# Patient Record
Sex: Male | Born: 1970 | Race: White | Hispanic: No | Marital: Married | Smoking: Never smoker
Health system: Southern US, Community
[De-identification: ages and names within clinical notes are randomized; demographics above are authoritative.]

---

## 2012-08-08 ENCOUNTER — Emergency Department: Payer: Self-pay | Admitting: Emergency Medicine

## 2012-08-10 ENCOUNTER — Emergency Department: Payer: Self-pay | Admitting: Unknown Physician Specialty

## 2018-07-19 ENCOUNTER — Encounter: Payer: Self-pay | Admitting: Emergency Medicine

## 2018-07-19 ENCOUNTER — Other Ambulatory Visit: Payer: Self-pay

## 2018-07-19 ENCOUNTER — Emergency Department
Admission: EM | Admit: 2018-07-19 | Discharge: 2018-07-19 | Disposition: A | Discharge status: Home / Self Care | Payer: BC Managed Care – PPO | Attending: Emergency Medicine | Admitting: Emergency Medicine

## 2018-07-19 ENCOUNTER — Emergency Department: Payer: BC Managed Care – PPO

## 2018-07-19 DIAGNOSIS — R202 Paresthesia of skin: Secondary | ICD-10-CM | POA: Diagnosis present

## 2018-07-19 DIAGNOSIS — Z79899 Other long term (current) drug therapy: Secondary | ICD-10-CM | POA: Insufficient documentation

## 2018-07-19 DIAGNOSIS — R002 Palpitations: Secondary | ICD-10-CM | POA: Diagnosis not present

## 2018-07-19 DIAGNOSIS — R200 Anesthesia of skin: Secondary | ICD-10-CM | POA: Insufficient documentation

## 2018-07-19 DIAGNOSIS — R61 Generalized hyperhidrosis: Secondary | ICD-10-CM | POA: Diagnosis not present

## 2018-07-19 LAB — DIFFERENTIAL
Abs Immature Granulocytes: 0.02 10*3/uL (ref 0.00–0.07)
Basophils Absolute: 0 10*3/uL (ref 0.0–0.1)
Basophils Relative: 0 %
Eosinophils Absolute: 0.1 10*3/uL (ref 0.0–0.5)
Eosinophils Relative: 2 %
Immature Granulocytes: 0 %
Lymphocytes Relative: 15 %
Lymphs Abs: 1 10*3/uL (ref 0.7–4.0)
Monocytes Absolute: 0.9 10*3/uL (ref 0.1–1.0)
Monocytes Relative: 13 %
Neutro Abs: 4.9 10*3/uL (ref 1.7–7.7)
Neutrophils Relative %: 70 %

## 2018-07-19 LAB — COMPREHENSIVE METABOLIC PANEL
ALT: 38 U/L (ref 0–44)
AST: 24 U/L (ref 15–41)
Albumin: 4.4 g/dL (ref 3.5–5.0)
Alkaline Phosphatase: 61 U/L (ref 38–126)
Anion gap: 10 (ref 5–15)
BUN: 9 mg/dL (ref 6–20)
CO2: 27 mmol/L (ref 22–32)
Calcium: 9.2 mg/dL (ref 8.9–10.3)
Chloride: 101 mmol/L (ref 98–111)
Creatinine, Ser: 0.86 mg/dL (ref 0.61–1.24)
GFR calc Af Amer: >60 mL/min (ref >60)
GFR calc non Af Amer: >60 mL/min (ref >60)
Glucose, Bld: 116 mg/dL — ABNORMAL HIGH (ref 70–99)
Potassium: 3.8 mmol/L (ref 3.5–5.1)
Sodium: 138 mmol/L (ref 135–145)
Total Bilirubin: 0.6 mg/dL (ref 0.3–1.2)
Total Protein: 7.5 g/dL (ref 6.5–8.1)

## 2018-07-19 LAB — CBC
HCT: 44.6 % (ref 39.0–52.0)
Hemoglobin: 15.3 g/dL (ref 13.0–17.0)
MCH: 32.5 pg (ref 26.0–34.0)
MCHC: 34.3 g/dL (ref 30.0–36.0)
MCV: 94.7 fL (ref 80.0–100.0)
Platelets: 211 10*3/uL (ref 150–400)
RBC: 4.71 MIL/uL (ref 4.22–5.81)
RDW: 12.2 % (ref 11.5–15.5)
WBC: 7 10*3/uL (ref 4.0–10.5)
nRBC: 0 % (ref 0.0–0.2)

## 2018-07-19 MED ORDER — ASPIRIN EC 81 MG PO TBEC
81.0000 mg | DELAYED_RELEASE_TABLET | Freq: Once | ORAL | Status: AC
  Administered 2018-07-19: 81 mg via ORAL

## 2018-07-19 MED ORDER — SODIUM CHLORIDE 0.9% FLUSH: 3.0000 mL | Freq: Once | INTRAVENOUS | Status: DC

## 2018-07-19 NOTE — ED Notes (Signed)
Reviewed discharge instructions, follow-up care, and medications with patient. Patient verbalized understanding of all information reviewed. Patient stable, with no distress noted at this time.    

## 2018-07-19 NOTE — ED Provider Notes (Signed)
[facility] Emergency Department Provider Note  ____________________________________________   First MD Initiated Contact with Patient 07/19/18 1902     (approximate)  I have reviewed the triage vital signs and the nursing notes.   HISTORY  Chief Complaint Numbness    HPI Serafina MitchellJason Carino is a 48 y.o. male reports no major past medical history  At about 5 PM, patient was at his home he started to feel a tingly feeling while resting in his left leg. Reports he thinks he might of "panic" a little bit surface of the tingling almost in his arm and also in his leg.  He never lost sensation but felt tingly.  Reports by the time he got to the waiting room at the ER his symptoms had got better and they have all gone away.  He did get tested for coronavirus about 3 days ago but the result is not returned.  He had some loose stools nausea and diarrhea which is resolved now.  Reports he is feeling quite a lot better.  He is eating and drinking well.  He was little dehydrated earlier, but that is better and he is staying hydrated now  No history of stroke.  Does not smoke.  Denies history of high blood pressure.  He did not have any trouble speaking.  There is no weakness in his arms or legs.  He is able to walk.  Did not have any facial droop.  All symptoms are resolved now.     History reviewed. No pertinent past medical history.  There are no active problems to display for this patient.   History reviewed. No pertinent surgical history.  Prior to Admission medications   Medication Sig Start Date End Date Taking? Authorizing Provider  omeprazole (PRILOSEC) 40 MG capsule Take 40 mg by mouth daily.   Yes [provider]    Allergies Patient has no known allergies.  History reviewed. No pertinent family history.  Social History Social History   Tobacco Use  . Smoking status: Never Smoker  . Smokeless tobacco: Never Used  Substance Use Topics  .  Alcohol use: Yes    Frequency: Never  . Drug use: Never    Review of Systems Constitutional: No fever/chills except as noted in HPI Eyes: No visual changes. ENT: No sore throat. Cardiovascular: Denies chest pain. Respiratory: Denies shortness of breath. Gastrointestinal: No abdominal pain.  See HPI, just getting over a stomach bug which is improved Genitourinary: Negative for dysuria. Musculoskeletal: Negative for back pain. Skin: Negative for rash. Neurological: Negative for headaches, areas of focal weakness or numbness except as noted.  All resolved now.    ____________________________________________   PHYSICAL EXAM:  VITAL SIGNS: ED Triage Vitals  Enc Vitals Group     BP 07/19/18 1806 108/80     Pulse Rate 07/19/18 1806 (!) 105     Resp 07/19/18 1824 14     Temp 07/19/18 1806 99.1 F (37.3 C)     Temp Source 07/19/18 1806 Oral     SpO2 07/19/18 1806 99 %     Weight 07/19/18 1807 172 lb (78 kg)     Height 07/19/18 1807 5\' 10"  (1.778 m)     Head Circumference --      Peak Flow --      Pain Score 07/19/18 1824 0     Pain Loc --      Pain Edu? --      Excl. in GC? --  Constitutional: Alert and oriented. Well appearing and in no acute distress. Eyes: Conjunctivae are normal. Head: Atraumatic. Nose: No congestion/rhinnorhea. Mouth/Throat: Mucous membranes are moist. Neck: No stridor.  Cardiovascular: Normal rate, regular rhythm. Grossly normal heart sounds.  Good peripheral circulation. Respiratory: Normal respiratory effort.  No retractions. Lungs CTAB. Gastrointestinal: Soft and nontender. No distention. Musculoskeletal: No lower extremity tenderness nor edema. Neurologic:  Normal speech and language. No gross focal neurologic deficits are appreciated.  His NIH score equals 0.  I performed neurologic examination in the presence of tele-stroke doctor as well, and we both agree his NIH is 0 Skin:  Skin is warm, dry and intact. No rash noted. Psychiatric: Mood  and affect are normal. Speech and behavior are normal.  ____________________________________________   LABS (all labs ordered are listed, but only abnormal results are displayed)  Labs Reviewed  COMPREHENSIVE METABOLIC PANEL - Abnormal; Notable for the following components:      Result Value   Glucose, Bld 116 (*)    All other components within normal limits  CBC  DIFFERENTIAL  PROTIME-INR  APTT  CBG MONITORING, ED   ____________________________________________  EKG  Reviewed and entered by me/interpreted at 1810 Heart rate 80 QRS 110 QTc 420 Normal sinus rhythm, no evidence of ischemia.  No A. fib ____________________________________________  RADIOLOGY  Normal CT of the head discussed with radiologist ____________________________________________   PROCEDURES  Procedure(s) performed: None  Procedures  Critical Care performed: No  ____________________________________________   INITIAL IMPRESSION / ASSESSMENT AND PLAN / ED COURSE  Pertinent labs & imaging results that were available during my care of the patient were reviewed by me and considered in my medical decision making (see chart for details).   Brief paresthesia involving left leg, perhaps some in the left arm as well.  Resolved prior to arrival.  Not associated with any gross neurologic deficits in his age NIH score is 0.  Clinical Course as of Jul 19 2026  Fri Jul 19, 2018  7858 Made aware of patient by triage nurse.  Working to see and evaluate him shortly, he did report his symptoms were gone.  Have to evaluate another patient prior to being able to see this gentleman, but I do know evaluation is primarily   [MQ]    Clinical Course User Index [MQ] Delman Kitten, MD   He had a brief gastrointestinal-like illness which she reports is resolved.  He is well-appearing and in no acute distress.  He has no risk factor for stroke.  Both tele-neurologist and I formulated a plan with the patient present and  with his input, and he is comfortable with the plan to follow-up with outpatient neurologist and start baby aspirin daily.  Neurology also recommends that he set up follow-up with cardiology for possible heart monitor to rule out A. fib, and the patient is in agreement with this.  Raedyn Wenke was evaluated in Emergency Department on 07/19/2018 for the symptoms described in the history of present illness. He was evaluated in the context of the global COVID-19 pandemic, which necessitated consideration that the patient might be at risk for infection with the SARS-CoV-2 virus that causes COVID-19. Institutional protocols and algorithms that pertain to the evaluation of patients at risk for COVID-19 are in a state of rapid change based on information released by regulatory bodies including the CDC and federal and state organizations. These policies and algorithms were followed during the patient's care in the ED.  He does not express active COVID symptoms.  He does however have a pending COVID test that should result in the next day.  Return precautions and treatment recommendations and follow-up discussed with the patient who is agreeable with the plan.   ____________________________________________   FINAL CLINICAL IMPRESSION(S) / ED DIAGNOSES  Final diagnoses:  Paresthesia        Note:  This document was prepared using Dragon voice recognition software and may include unintentional dictation errors       Sharyn CreamerQuale, Mark, MD 07/19/18 2034

## 2018-07-19 NOTE — Consult Note (Signed)
[facility] Tele[facility]urology Consult Services   Date of Service:   07/19/2018 19:11:44  Impression:     .  Transient Ischemic Attack     .  Dehydration  Comments/Sign-Out: Patient history is not typical for TIA. He does not have any vascular risk factors. His CAT scan looks normal. His labs are normal. His NIH stroke scale is 0. On cardiac monitor, he appears to be in sinus rhythm. I discussed this in detail with him and the ED attending Dr Fanny BienQuale. We talked in detail about treatment options. We talked about putting him on a cardiac monitor, neurology and cardiology follow-up. In the meantime we talked about keeping him on aspirin. All this was discussed in detail with the patient and he is agreeable with treatment plan. He was told to come back to the hospital in case he has any further symptoms. We spent extensive time on counseling.  Metrics: Last Known Well: 07/19/2018 16:30:00 [facility] Notification Time: 07/19/2018 19:11:44 Arrival Time: 07/19/2018 16:56:00 Stamp Time: 07/19/2018 19:11:44 Time First Login Attempt: 07/19/2018 19:15:10 Video Start Time: 07/19/2018 19:15:10  Symptoms: left arm numbness NIHSS Start Assessment Time: 07/19/2018 19:21:46 Patient is not a candidate for tPA. Patient was not deemed candidate for tPA thrombolytics because of Resolved symptoms (no residual disabling symptoms). Video End Time: 07/19/2018 19:33:41  CT head showed no acute hemorrhage or acute core infarct.  Clinical Presentation is not Suggestive of Large Vessel Occlusive Disease  ED Physician notified of diagnostic impression and management plan on 07/19/2018 19:31:23  Our recommendations are outlined below.  Recommendations:     .  Activate Stroke Protocol Admission/Order Set     .  Stroke/Telemetry Floor     .  Neuro Checks     .  Bedside Swallow Eval     .  DVT Prophylaxis     .  IV Fluids, Normal Saline     .  Head of Bed 30 Degrees     .  Euglycemia and  Avoid Hyperthermia (PRN Acetaminophen)     .  Antiplatelet Therapy Recommended  Routine Consultation with Inhouse Neurology for Follow up Care  Sign Out:     .  Discussed with Emergency Department Provider     .  Discussed with Rapid Response Team    ------------------------------------------------------------------------------  History of Present Illness: Patient is a 48 year old Male.  Patient was brought by private transportation with symptoms of left arm numbness  Extremely pleasant 48 year old male with no past medical history who has had some diarrhea with nausea over the past few days came to the hospital because of some dizziness. He reports he jumped up from sitting position and when he stood up, he felt dizzy and felt some tingling in his left arm. He also has been complaining of his heart racing fast. He came to the hospital. He reports he is not feeling anything at this point. He feels he is back to his self. Denies any problem with his speech or swallowing. Denies any focal numbness, tingling gait or balance issues at this point.  Last seen normal was within 4.5 hours. There is no history of hemorrhagic complications or intracranial hemorrhage. There is no history of Recent Anticoagulants. There is no history of recent major surgery. There is no history of recent stroke.  Examination: 1A: Level of Consciousness - Alert; keenly responsive + 0 1B: Ask Month and Age - Both Questions Right + 0 1C: Blink Eyes & Squeeze Hands - Performs Both Tasks +  0 2: Test Horizontal Extraocular Movements - Normal + 0 3: Test Visual Fields - No Visual Loss + 0 4: Test Facial Palsy (Use Grimace if Obtunded) - Normal symmetry + 0 5A: Test Left Arm Motor Drift - No Drift for 10 Seconds + 0 5B: Test Right Arm Motor Drift - No Drift for 10 Seconds + 0 6A: Test Left Leg Motor Drift - No Drift for 5 Seconds + 0 6B: Test Right Leg Motor Drift - No Drift for 5 Seconds + 0 7: Test Limb Ataxia  (FNF/Heel-Shin) - No Ataxia + 0 8: Test Sensation - Normal; No sensory loss + 0 9: Test Language/Aphasia - Normal; No aphasia + 0 10: Test Dysarthria - Normal + 0 11: Test Extinction/Inattention - No abnormality + 0  NIHSS Score: 0  Patient/Family was informed the Neurology Consult would happen via TeleHealth consult by way of interactive audio and video telecommunications and consented to receiving care in this manner.  Due to the immediate potential for life-threatening deterioration due to underlying acute neurologic illness, I spent 35 minutes providing critical care. This time includes time for face to face visit via telemedicine, review of medical records, imaging studies and discussion of findings with providers, the patient and/or family.   Dr Faustino Congress   [facility] [phone]  Case [id number]

## 2018-07-19 NOTE — Discharge Instructions (Addendum)
Please start taking a baby aspirin, 81 mg, daily.  Follow-up closely with neurology, also set up a close follow-up with cardiology for consideration of heart monitoring as well.  Return to the emergency room right away if you develop a facial droop, weakness in arm or leg, trouble speaking, severe headache, numbness returns, or other new concerns arise

## 2018-07-19 NOTE — ED Notes (Addendum)
Patient reports earlier episode of palpitations, left arm and leg numbness/tingling (more pronounced in the arm), diaphoresis, that has since resolved. Episode lasted a very short duration.

## 2018-07-19 NOTE — ED Triage Notes (Signed)
Today at 1700 started feeling like heart racing and tingling in left arm and left leg.  Was also having dizziness.  Symptoms have now resolved.  Lasted approximately 20-30 minutes.  Pt was watching TV when started.

## 2018-07-19 NOTE — ED Notes (Signed)
ED Provider at bedside. 

## 2018-07-19 NOTE — ED Notes (Signed)
Code stroke cart placed in room

## 2018-08-12 ENCOUNTER — Telehealth: Payer: Self-pay | Admitting: *Deleted

## 2018-08-12 ENCOUNTER — Ambulatory Visit: Payer: Self-pay | Admitting: Neurology

## 2018-08-12 NOTE — Telephone Encounter (Signed)
Pt called to cancel his new patient appt the same day.  He had another commitment and was "unable to be in two places at one time" (per Neldon Labella who took the call).

## 2018-08-13 ENCOUNTER — Encounter: Payer: Self-pay | Admitting: Neurology

## 2019-03-27 ENCOUNTER — Ambulatory Visit: Payer: BC Managed Care – PPO | Attending: Internal Medicine

## 2019-03-27 DIAGNOSIS — Z23 Encounter for immunization: Secondary | ICD-10-CM | POA: Insufficient documentation

## 2019-03-27 NOTE — Progress Notes (Signed)
   Covid-19 Vaccination Clinic  Name:  Donald Hunter    MRN: [id number] DOB: Jan 09, 1971  03/27/2019  Mr. Hence was observed post Covid-19 immunization for 15 minutes without incidence. He was provided with Vaccine Information Sheet and instruction to access the V-Safe system.   [name] was instructed to call 911 with any severe reactions post vaccine: Marland Kitchen Difficulty breathing  . Swelling of your face and throat  . A fast heartbeat  . A bad rash all over your body  . Dizziness and weakness    Immunizations Administered    Name Date Dose VIS Date Route   Pfizer COVID-19 Vaccine 03/27/2019 10:53 AM 0.3 mL 01/10/2019 Intramuscular   Manufacturer: [employer], [employer]   Lot: [id number]   NDC: [address]-0

## 2019-04-16 ENCOUNTER — Ambulatory Visit: Payer: BC Managed Care – PPO | Attending: Internal Medicine

## 2019-04-16 DIAGNOSIS — Z23 Encounter for immunization: Secondary | ICD-10-CM

## 2019-04-16 NOTE — Progress Notes (Signed)
   Covid-19 Vaccination Clinic  Name:  Donald Hunter    MRN: [id number] DOB: [birth year]  04/16/2019  [name] was observed post Covid-19 immunization for 15 minutes without incident. He was provided with Vaccine Information Sheet and instruction to access the V-Safe system.   [name] was instructed to call 911 with any severe reactions post vaccine: Marland Kitchen Difficulty breathing  . Swelling of face and throat  . A fast heartbeat  . A bad rash all over body  . Dizziness and weakness   Immunizations Administered    Name Date Dose VIS Date Route   Pfizer COVID-19 Vaccine 04/16/2019 12:45 PM 0.3 mL 01/10/2019 Intramuscular   Manufacturer: [employer], [employer]   Lot: [id number]   NDC: [address]-4

## 2019-11-29 IMAGING — CT CT HEAD CODE STROKE W/O CM
3 series · 15 of 47 positions shown, 18 images · non-contrast
Comparison: None available.

CLINICAL DATA: Code stroke. Initial evaluation for acute chest
pain, left-sided tingling. Dizziness.

EXAM:
CT HEAD WITHOUT CONTRAST
TECHNIQUE: Contiguous axial images were obtained from the base of the skull
through the vertex without intravenous contrast.

[Series 2: head wo · axial · 0.42mm/px · z∈[-161,-36]mm · 9 of 31 slices shown, 12 images]
[im 3/31  brain]
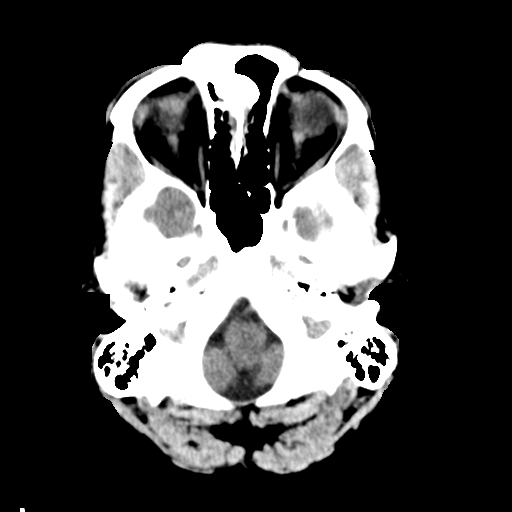
[im 3/31  bone]
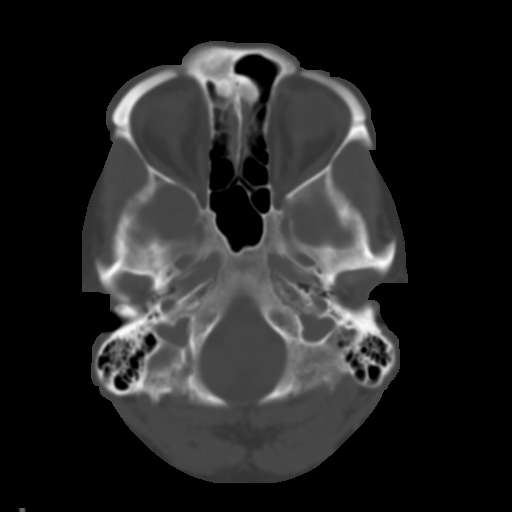
[im 6/31  brain]
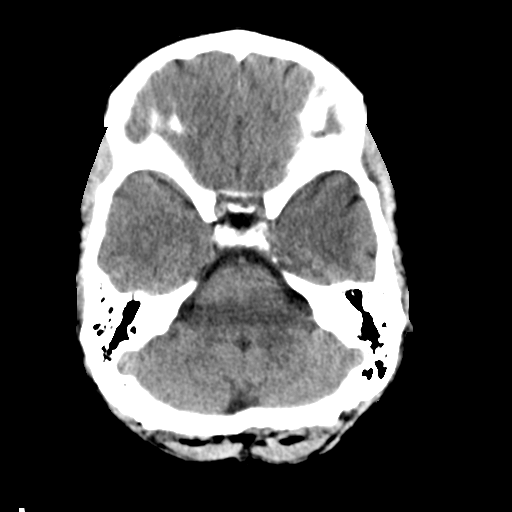
[im 9/31  brain]
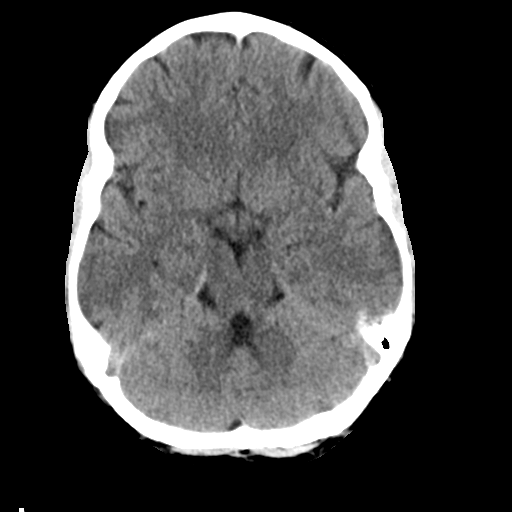
[im 12/31  brain]
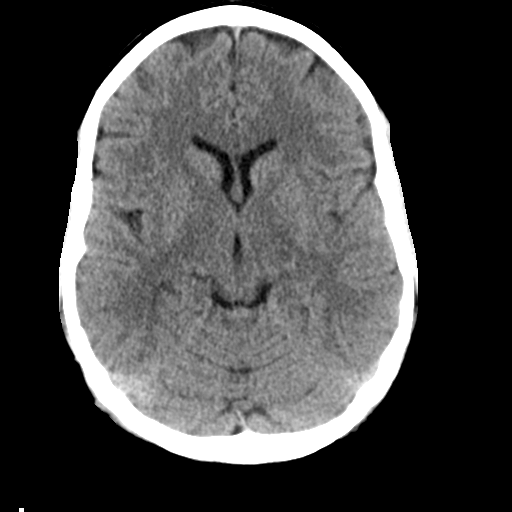
[im 16/31  brain]
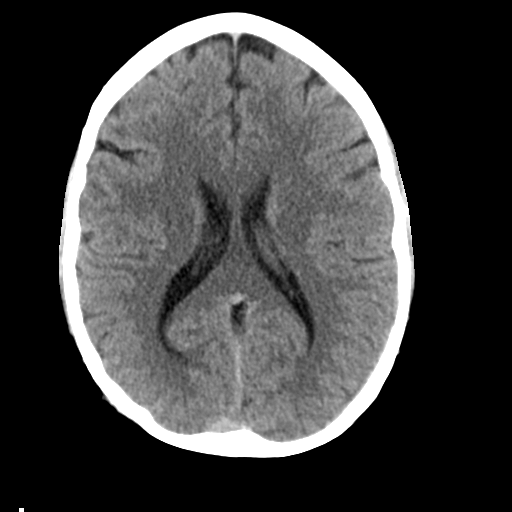
[im 16/31  bone]
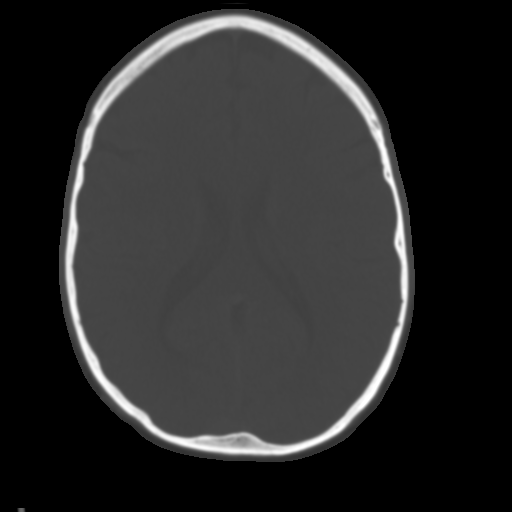
[im 19/31  brain]
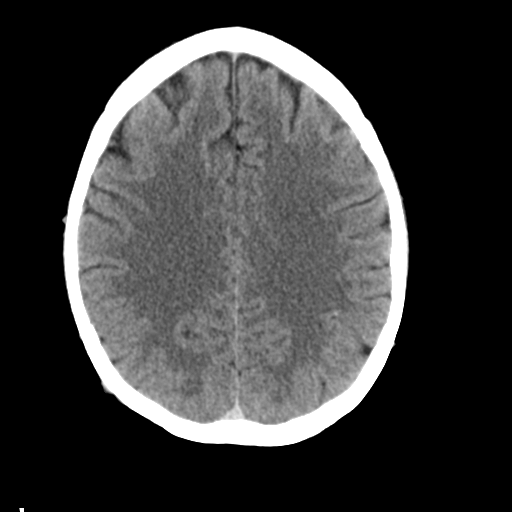
[im 22/31  brain]
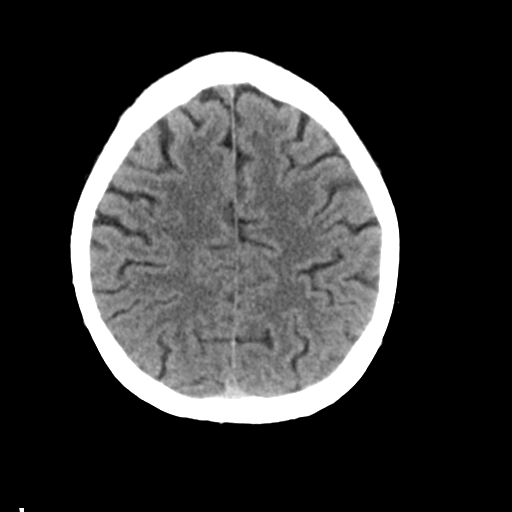
[im 25/31  brain]
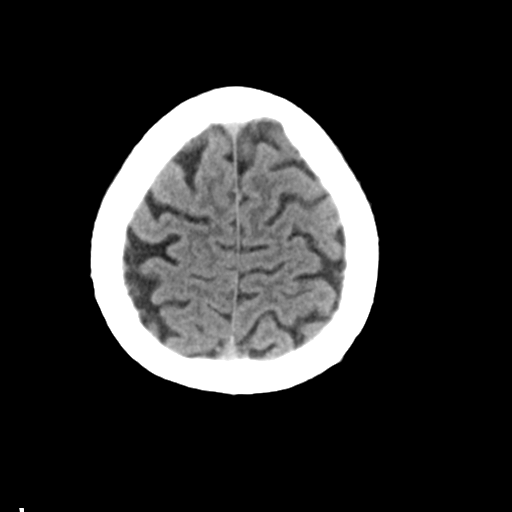
[im 28/31  brain]
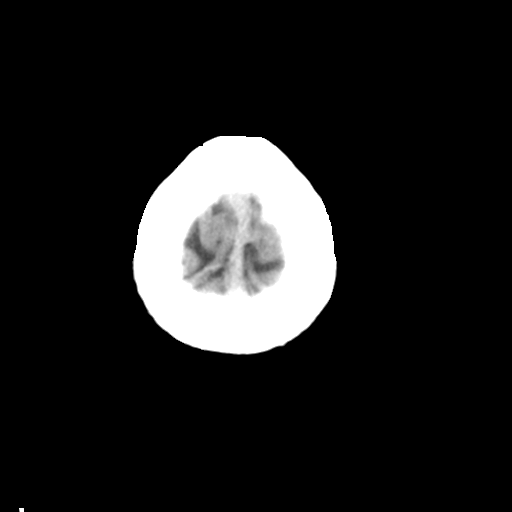
[im 28/31  bone]
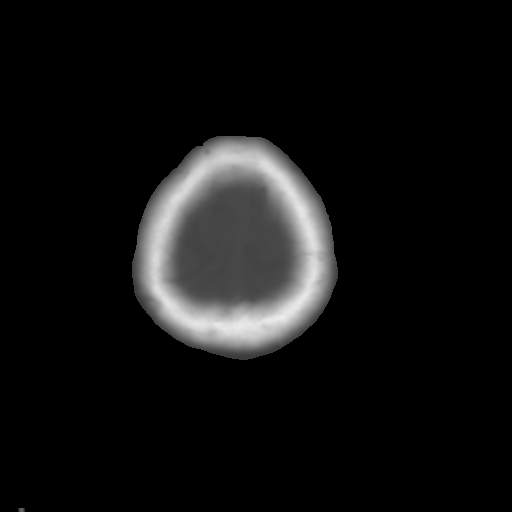

[Series 4: coronal soft tissue · coronal · 0.31mm/px · 3 of 66 slices shown]
[im 22/66  brain]
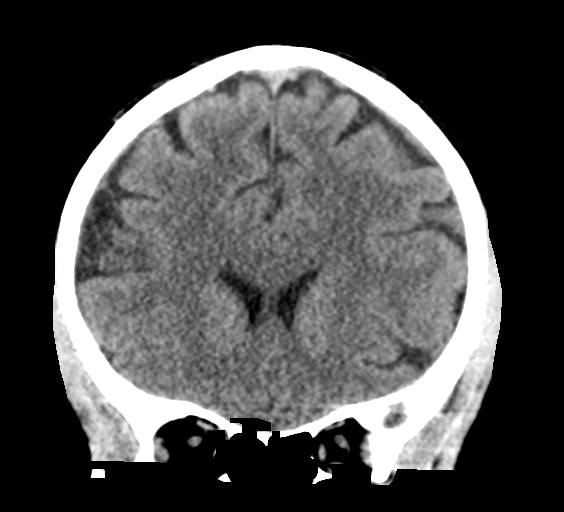
[im 29/66  brain]
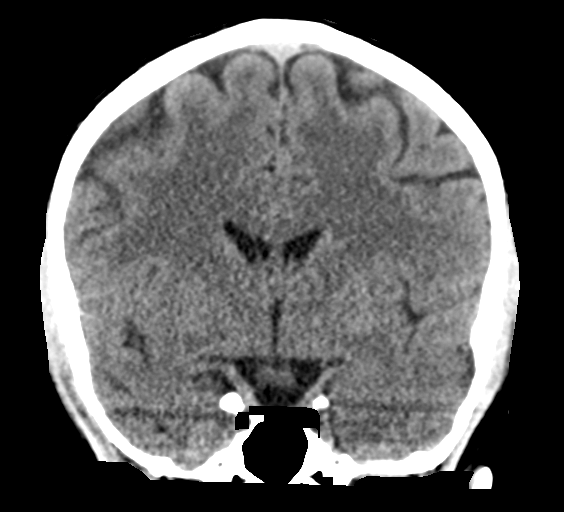
[im 37/66  brain]
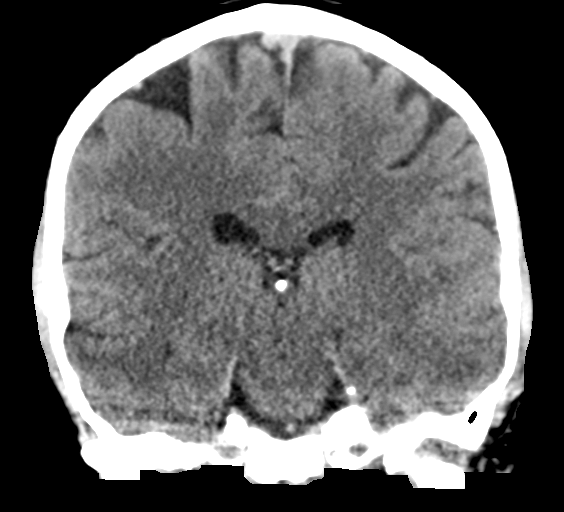

[Series 5: sagittal soft tissue · sagittal · 0.31mm/px · 3 of 55 slices shown]
[im 19/55  brain]
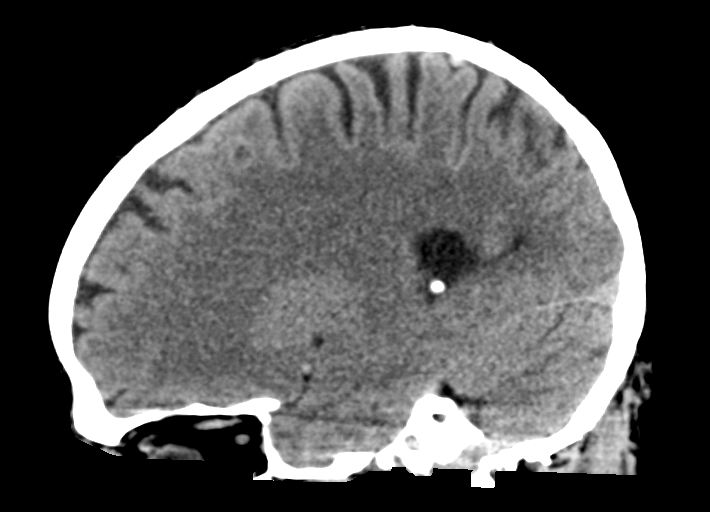
[im 28/55  brain]
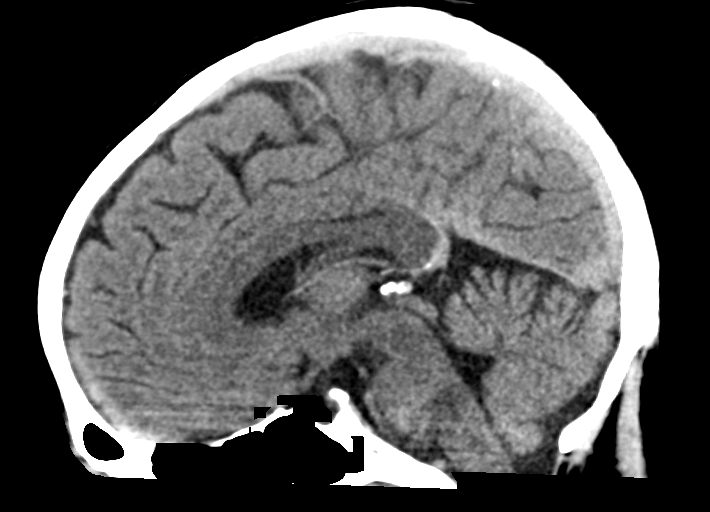
[im 37/55  brain]
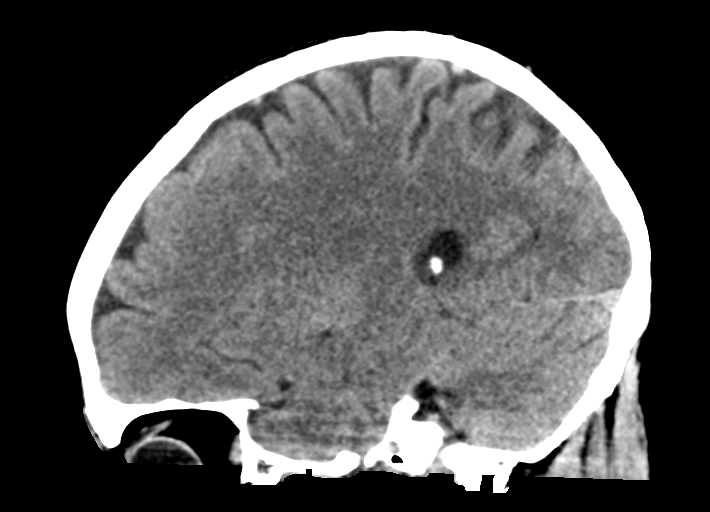

[15 of 47 positions shown; findings below may reference images not displayed]

FINDINGS: Brain: Cerebral volume within normal limits for patient age.

No evidence for acute intracranial hemorrhage. No findings to
suggest acute large vessel territory infarct. No mass lesion,
midline shift, or mass effect. Ventricles are normal in size without
evidence for hydrocephalus. No extra-axial fluid collection
identified.

Vascular: No hyperdense vessel identified.

Skull: Scalp soft tissues demonstrate no acute abnormality.
Calvarium intact.

Sinuses/Orbits: Globes and orbital soft tissues within normal
limits.

Visualized paranasal sinuses are clear. No mastoid effusion.

ASPECTS (Alberta Stroke Program Early CT Score)

- Ganglionic level infarction (caudate, lentiform nuclei, internal
capsule, insula, M1-M3 cortex): 7

- Supraganglionic infarction (M4-M6 cortex): 3

Total score (0-10 with 10 being normal): 10
IMPRESSION: 1. Normal head CT. No acute intracranial infarct or other
abnormality identified.
2. ASPECTS is 10.

Critical Value/emergent results were called by telephone at the time
of interpretation on 07/19/2018 at [DATE] to Dr. JETMIRI MADRIT , who
verbally acknowledged these results.
